# Patient Record
Sex: Female | Born: 1980 | Race: Black or African American | Hispanic: No | Marital: Single | Smoking: Never smoker
Health system: Southern US, Community
[De-identification: ages and names within clinical notes are randomized; demographics above are authoritative.]

## PROBLEM LIST (undated history)

## (undated) DIAGNOSIS — G932 Benign intracranial hypertension: Secondary | ICD-10-CM

## (undated) HISTORY — PX: OTHER SURGICAL HISTORY: SHX169

---
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
## 2002-02-02 ENCOUNTER — Emergency Department (HOSPITAL_COMMUNITY): Admission: EM | Admit: 2002-02-02 | Discharge: 2002-02-03 | Payer: Self-pay | Admitting: Emergency Medicine

## 2011-07-09 ENCOUNTER — Encounter (HOSPITAL_BASED_OUTPATIENT_CLINIC_OR_DEPARTMENT_OTHER): Payer: Self-pay | Admitting: *Deleted

## 2011-07-09 ENCOUNTER — Emergency Department (HOSPITAL_BASED_OUTPATIENT_CLINIC_OR_DEPARTMENT_OTHER)
Admission: EM | Admit: 2011-07-09 | Discharge: 2011-07-10 | Disposition: A | Discharge status: Home / Self Care | Payer: Self-pay | Attending: Emergency Medicine | Admitting: Emergency Medicine

## 2011-07-09 DIAGNOSIS — M542 Cervicalgia: Secondary | ICD-10-CM | POA: Insufficient documentation

## 2011-07-09 DIAGNOSIS — R51 Headache: Secondary | ICD-10-CM | POA: Insufficient documentation

## 2011-07-09 HISTORY — DX: Benign intracranial hypertension: G93.2

## 2011-07-09 NOTE — ED Notes (Signed)
Headache x 1 week. Hx of same. Pain is on the right side of her head into her neck.

## 2011-07-10 ENCOUNTER — Emergency Department (INDEPENDENT_AMBULATORY_CARE_PROVIDER_SITE_OTHER): Payer: Self-pay

## 2011-07-10 ENCOUNTER — Encounter (HOSPITAL_BASED_OUTPATIENT_CLINIC_OR_DEPARTMENT_OTHER): Payer: Self-pay | Admitting: Emergency Medicine

## 2011-07-10 DIAGNOSIS — R51 Headache: Secondary | ICD-10-CM

## 2011-07-10 MED ORDER — DIPHENHYDRAMINE HCL 50 MG/ML IJ SOLN
25.0000 mg | Freq: Once | INTRAMUSCULAR | Status: AC
  Administered 2011-07-10: 25 mg via INTRAVENOUS
  Filled 2011-07-10: qty 1

## 2011-07-10 MED ORDER — METOCLOPRAMIDE HCL 5 MG/ML IJ SOLN
10.0000 mg | Freq: Once | INTRAMUSCULAR | Status: AC
  Administered 2011-07-10: 10 mg via INTRAVENOUS
  Filled 2011-07-10: qty 2

## 2011-07-10 MED ORDER — SODIUM CHLORIDE 0.9 % IV BOLUS (SEPSIS)
1000.0000 mL | Freq: Once | INTRAVENOUS | Status: AC
  Administered 2011-07-10: 1000 mL via INTRAVENOUS

## 2011-07-10 NOTE — Discharge Instructions (Signed)
General Headache, Without Cause A general headache has no specific cause. These headaches are not life-threatening. They will not lead to other types of headaches. HOME CARE   Make and keep follow-up visits with your doctor.   Only take medicine as told by your doctor.   Try to relax, get a massage, or use your thoughts to control your body (biofeedback).   Apply cold or heat to the head and neck. Apply 3 or 4 times a day or as needed.   GET HELP RIGHT AWAY IF:   You have problems with medicine.   Your medicine does not help relieve pain.   Your headache changes or becomes worse.   You feel sick to your stomach (nauseous) or throw up (vomit).   You have a temperature by mouth above 102 F (38.9 C), not controlled by medicine.   Your have a stiff neck.   You have vision loss.   You have muscle weakness.   You lose control of your muscles.   You lose balance or have trouble walking.   You feel like you are going to pass out (faint).  MAKE SURE YOU:   Understand these instructions.   Will watch this condition.   Will get help right away if you are not doing well or get worse.  Document Released: 01/17/2008 Document Revised: 03/29/2011 Document Reviewed: 01/17/2008 [employer] Patient Information 2012 [employer]. 

## 2011-07-10 NOTE — ED Provider Notes (Signed)
History    This chart was scribed for Hanley Seamen, MD, MD by Smitty Pluck. The patient was seen in room MH02 and the patient's care was started at 12:24AM.   CSN: [id number]  Arrival date & time 07/09/11  2106   First MD Initiated Contact with Patient 07/10/11 0023      Chief Complaint  Patient presents with  . Headache    (Consider location/radiation/quality/duration/timing/severity/associated sxs/prior treatment) Patient is a 31 y.o. female presenting with headaches. The history is provided by the patient.  Headache    Kimberly Oconnor is a 31 y.o. female who presents to the Emergency Department complaining of moderate right sided headache onset 1 week. Pt reports that this is different from past headaches. She states that the pain has gotten worse tonight. She reports the pain is sharp and radiates down neck. She reports taking advil without relief. The pain has been constant. She was diagnosed with pseudotumor cerebri (11 years ago and she had spinal taps for treatment). Denies photophobia and hx of migraines.  Past Medical History  Diagnosis Date  . Pseudotumor cerebri     remote history    Past Surgical History  Procedure Date  . Kidney removed     No family history on file.  History  Substance Use Topics  . Smoking status: Never Smoker   . Smokeless tobacco: Not on file  . Alcohol Use: No    OB History    Grav Para Term Preterm Abortions TAB SAB Ect Mult Living                  Review of Systems  Neurological: Positive for headaches.  All other systems reviewed and are negative.    Allergies  Review of patient's allergies indicates no known allergies.  Home Medications   Current Outpatient Rx  Name Route Sig Dispense Refill  . IBUPROFEN 200 MG PO TABS Oral Take 400 mg by mouth every 6 (six) hours as needed. Patient used this medication for her headache.      BP 112/70  Pulse 72  Temp(Src) 98.7 F (37.1 C) (Oral)  Resp 18  Wt 235 lb  (106.595 kg)  SpO2 100%  LMP 06/20/2011  Physical Exam  Nursing note and vitals reviewed. Constitutional: She is oriented to person, place, and time. She appears well-developed and well-nourished. No distress.  HENT:  Head: Normocephalic and atraumatic.  Eyes: Conjunctivae are normal. Pupils are equal, round, and reactive to light.       Disc margins sharp  no pappiladema    Neck: Normal range of motion. Neck supple.  Cardiovascular: Normal rate, regular rhythm and normal heart sounds.   Pulmonary/Chest: Effort normal and breath sounds normal. No respiratory distress.  Abdominal: Soft. She exhibits no distension. There is no tenderness.  Neurological: She is alert and oriented to person, place, and time.       Nl rhomberg Nl finger to nose Nl coordination   Skin: Skin is warm and dry.  Psychiatric: She has a normal mood and affect. Her behavior is normal.    ED Course  Procedures (including critical care time) DIAGNOSTIC STUDIES: Oxygen Saturation is 100% on room air, normal by my interpretation.    COORDINATION OF CARE: 12:45AM EDP ordered medication: NaCl 0.9% bolus, benadryl 25 mg, reglan 10 mg      MDM  Nursing notes and vitals signs, including pulse oximetry, reviewed.  Summary of this visit's results, reviewed by myself:  Labs:  No results  found for this or any previous visit.  Imaging Studies: Ct Head Wo Contrast  07/10/2011  *RADIOLOGY REPORT*  Clinical Data: Headaches  CT HEAD WITHOUT CONTRAST  Technique:  Contiguous axial images were obtained from the base of the skull through the vertex without contrast.  Comparison: None  Findings: The brain has a normal appearance without evidence for hemorrhage, infarction, hydrocephalus, or mass lesion.  There is no extra axial fluid collection.  The skull and paranasal sinuses are normal.  IMPRESSION:  1.  No acute intracranial abnormalities.  Original Report Authenticated By: Rosealee Albee, M.D.     I personally  performed the services described in this documentation, which was scribed in my presence.  The recorded information has been reviewed and considered.  1:57 AM Sleeping comfortably after IV fluids, IV Benadryl and IV Reglan. On awakening patient states she is not require any additional analgesia.          Hanley Seamen, MD 07/10/11 [phone]

## 2017-02-16 ENCOUNTER — Emergency Department (HOSPITAL_COMMUNITY)
Admission: EM | Admit: 2017-02-16 | Discharge: 2017-02-16 | Disposition: A | Discharge status: Home / Self Care | Payer: BLUE CROSS/BLUE SHIELD | Attending: Emergency Medicine | Admitting: Emergency Medicine

## 2017-02-16 ENCOUNTER — Emergency Department (HOSPITAL_COMMUNITY): Payer: BLUE CROSS/BLUE SHIELD

## 2017-02-16 ENCOUNTER — Encounter (HOSPITAL_COMMUNITY): Payer: Self-pay | Admitting: Emergency Medicine

## 2017-02-16 DIAGNOSIS — M79642 Pain in left hand: Secondary | ICD-10-CM | POA: Insufficient documentation

## 2017-02-16 DIAGNOSIS — R519 Headache, unspecified: Secondary | ICD-10-CM

## 2017-02-16 DIAGNOSIS — Y9389 Activity, other specified: Secondary | ICD-10-CM | POA: Insufficient documentation

## 2017-02-16 DIAGNOSIS — Y999 Unspecified external cause status: Secondary | ICD-10-CM | POA: Diagnosis not present

## 2017-02-16 DIAGNOSIS — Y92410 Unspecified street and highway as the place of occurrence of the external cause: Secondary | ICD-10-CM | POA: Insufficient documentation

## 2017-02-16 DIAGNOSIS — R51 Headache: Secondary | ICD-10-CM | POA: Insufficient documentation

## 2017-02-16 DIAGNOSIS — R2232 Localized swelling, mass and lump, left upper limb: Secondary | ICD-10-CM | POA: Insufficient documentation

## 2017-02-16 DIAGNOSIS — R0789 Other chest pain: Secondary | ICD-10-CM | POA: Diagnosis not present

## 2017-02-16 DIAGNOSIS — M542 Cervicalgia: Secondary | ICD-10-CM | POA: Insufficient documentation

## 2017-02-16 MED ORDER — KETOROLAC TROMETHAMINE 60 MG/2ML IM SOLN
30.0000 mg | Freq: Once | INTRAMUSCULAR | Status: AC
  Administered 2017-02-16: 30 mg via INTRAMUSCULAR
  Filled 2017-02-16: qty 2

## 2017-02-16 MED ORDER — CYCLOBENZAPRINE HCL 10 MG PO TABS: 10.0000 mg | ORAL_TABLET | Freq: Two times a day (BID) | ORAL | 0 refills | Status: AC | PRN

## 2017-02-16 MED ORDER — CYCLOBENZAPRINE HCL 10 MG PO TABS
10.0000 mg | ORAL_TABLET | Freq: Once | ORAL | Status: AC
  Administered 2017-02-16: 10 mg via ORAL
  Filled 2017-02-16: qty 1

## 2017-02-16 MED ORDER — HYDROCODONE-ACETAMINOPHEN 5-325 MG PO TABS
1.0000 | ORAL_TABLET | Freq: Once | ORAL | Status: AC
  Administered 2017-02-16: 1 via ORAL
  Filled 2017-02-16: qty 1
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
## 2017-02-16 NOTE — Discharge Instructions (Signed)

## 2017-02-16 NOTE — ED Notes (Signed)
One touch pt, see provider assessment
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
